# Patient Record
Sex: Male | Born: 1975 | Hispanic: No | Marital: Married | Smoking: Current every day smoker
Health system: Southern US, Community
[De-identification: ages and names within clinical notes are randomized; demographics above are authoritative.]

## PROBLEM LIST (undated history)

## (undated) DIAGNOSIS — J45909 Unspecified asthma, uncomplicated: Secondary | ICD-10-CM

## (undated) DIAGNOSIS — G43909 Migraine, unspecified, not intractable, without status migrainosus: Secondary | ICD-10-CM

---

## 2016-10-17 ENCOUNTER — Other Ambulatory Visit: Payer: Self-pay | Admitting: Nurse Practitioner

## 2016-10-17 DIAGNOSIS — G43019 Migraine without aura, intractable, without status migrainosus: Secondary | ICD-10-CM

## 2016-11-01 ENCOUNTER — Ambulatory Visit
Admission: RE | Admit: 2016-11-01 | Discharge: 2016-11-01 | Disposition: A | Discharge status: Home / Self Care | Payer: BC Managed Care – PPO | Source: Ambulatory Visit | Attending: Nurse Practitioner | Admitting: Nurse Practitioner

## 2016-11-01 DIAGNOSIS — G43109 Migraine with aura, not intractable, without status migrainosus: Secondary | ICD-10-CM | POA: Insufficient documentation

## 2016-11-01 DIAGNOSIS — J349 Unspecified disorder of nose and nasal sinuses: Secondary | ICD-10-CM | POA: Diagnosis not present

## 2016-11-01 DIAGNOSIS — G930 Cerebral cysts: Secondary | ICD-10-CM | POA: Insufficient documentation

## 2016-11-01 DIAGNOSIS — G43019 Migraine without aura, intractable, without status migrainosus: Secondary | ICD-10-CM | POA: Diagnosis present

## 2016-11-01 MED ORDER — GADOBENATE DIMEGLUMINE 529 MG/ML IV SOLN: 20.0000 mL | Freq: Once | INTRAVENOUS | Status: DC | PRN

## 2018-02-19 IMAGING — MR MR HEAD WO/W CM
10 series · 46 of 48 positions shown · IV contrast (multihance)
Comparison: None.

CLINICAL DATA: 41-year-old male with headaches, associated orbital
pressure. Some associated dizziness and visual changes.

EXAM:
MRI HEAD WITHOUT AND WITH CONTRAST
TECHNIQUE: Multiplanar, multiecho pulse sequences of the brain and surrounding
structures were obtained without and with intravenous contrast.
CONTRAST:  20 mL MultiHance

[Series 3: DWI · axial · 3.0mm · 1.20mm/px · z∈[-58,+104]mm · 4 of 55 slices shown (1 of 2)]
[im 1/55]
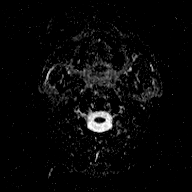
[im 19/55]
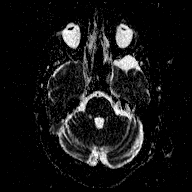
[im 37/55]
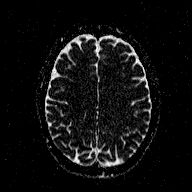
[im 55/55]
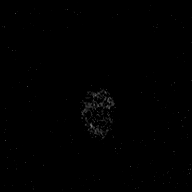

[Series 4: T1 · sagittal · 5.0mm · 0.45mm/px · 2 of 23 slices shown (1 of 2)]
[im 1/23]
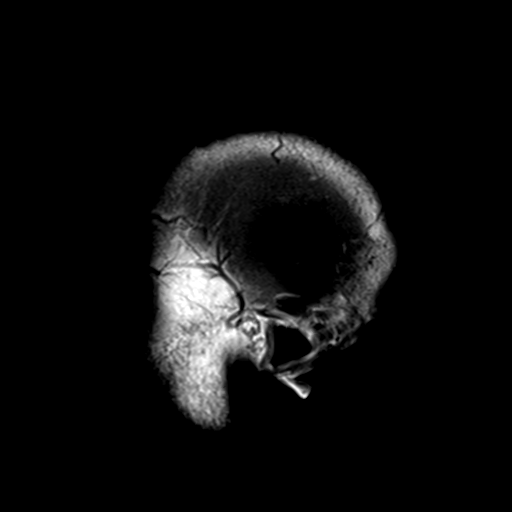
[im 23/23]
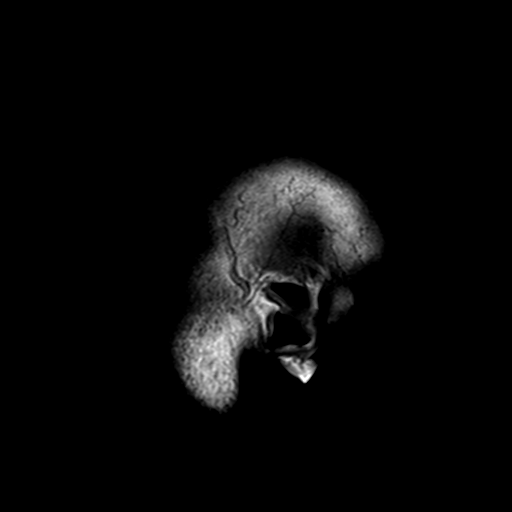

[Series 5: T2 · axial · 5.0mm · 0.72mm/px · z∈[-58,+103]mm · 2 of 24 slices shown (1 of 2)]
[im 1/24]
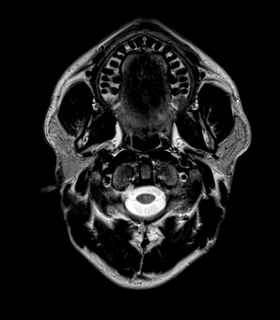
[im 24/24]
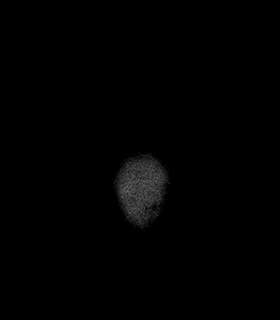

[Series 6: FLAIR · axial · 3.0mm · 0.45mm/px · z∈[-58,+104]mm · 4 of 55 slices shown]
[im 1/55]
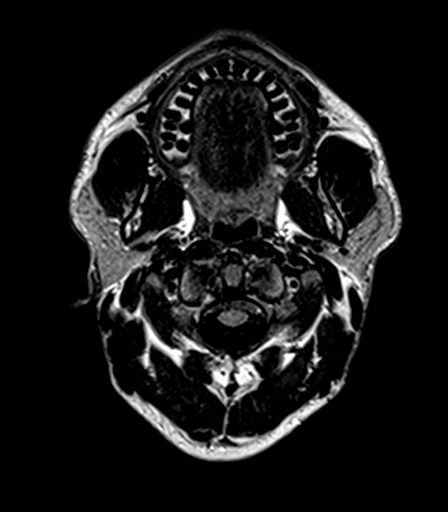
[im 19/55]
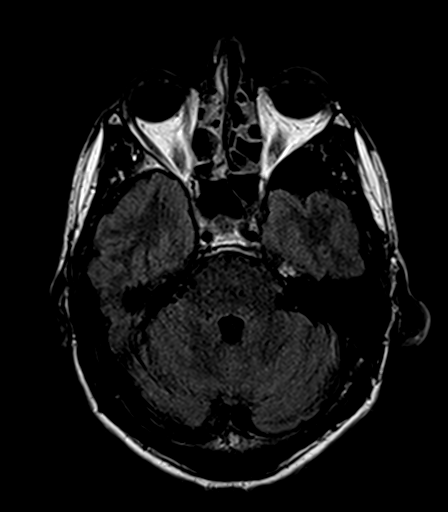
[im 37/55]
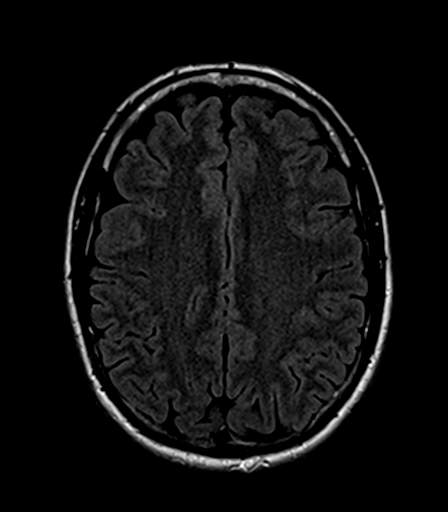
[im 55/55]
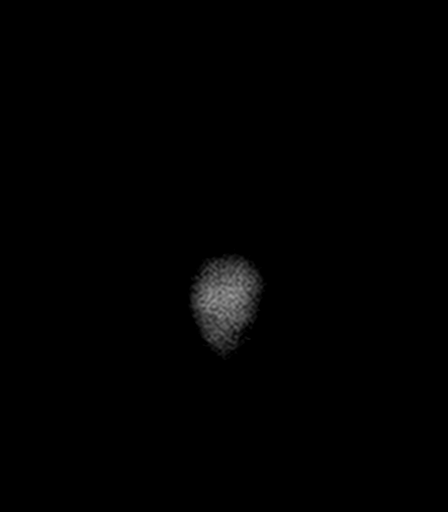

[Series 7: T2 · axial · 5.0mm · 0.72mm/px · z∈[-58,+103]mm · 2 of 24 slices shown (2 of 2)]
[im 1/24]
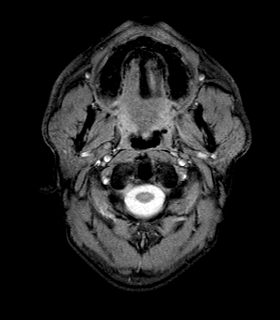
[im 24/24]
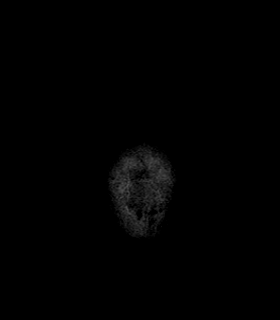

[Series 8: T1 · axial · 1.0mm · 1.00mm/px · z∈[-57,+102]mm · 10 of 160 slices shown (2 of 2)]
[im 1/160]
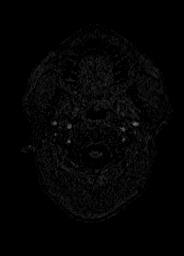
[im 15/160]
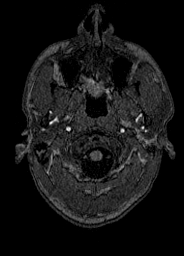
[im 29/160]
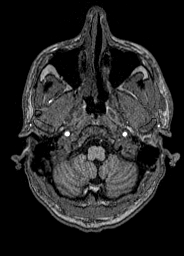
[im 44/160]
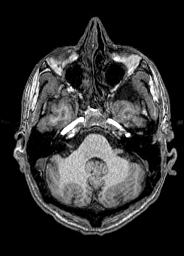
[im 58/160]
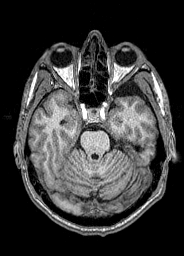
[im 73/160]
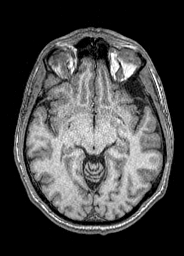
[im 87/160]
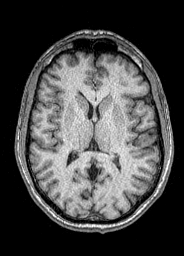
[im 116/160]
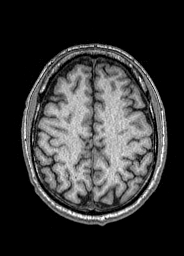
[im 131/160]
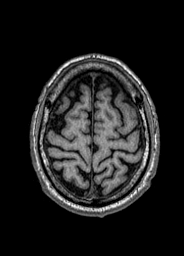
[im 160/160]
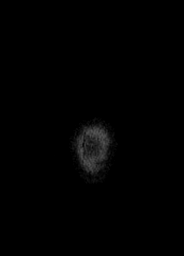

[Series 9: T2 post-contrast · coronal · 5.0mm · 0.45mm/px · 3 of 33 slices shown]
[im 1/33]
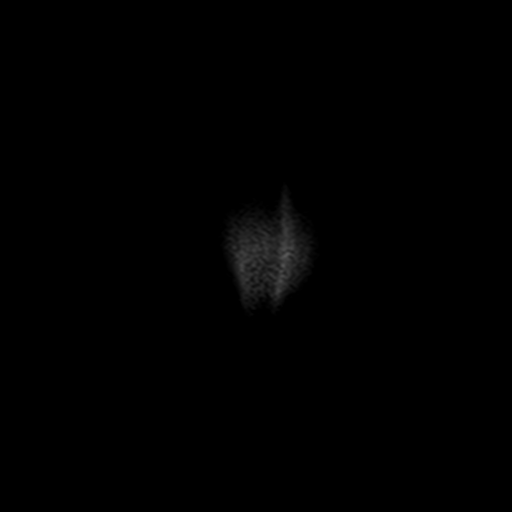
[im 17/33]
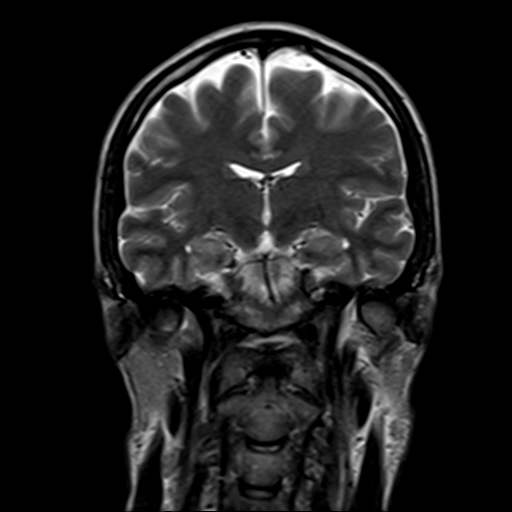
[im 33/33]
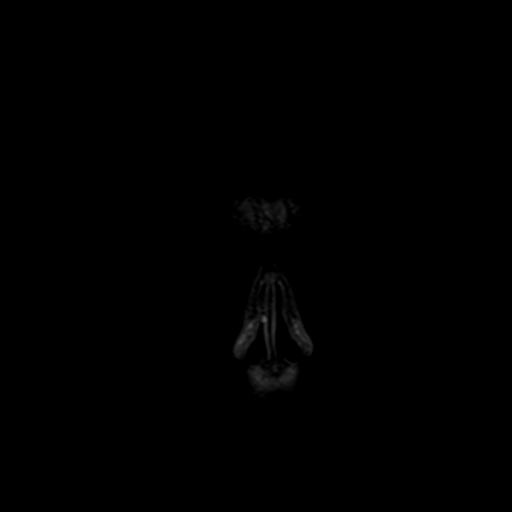

[Series 10: T1 post-contrast · axial · 1.0mm · 1.00mm/px · z∈[-57,+102]mm · 12 of 160 slices shown (1 of 2)]
[im 1/160]
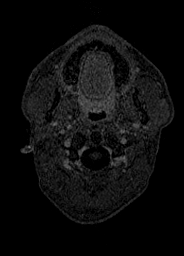
[im 15/160]
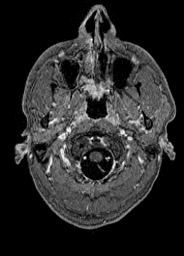
[im 29/160]
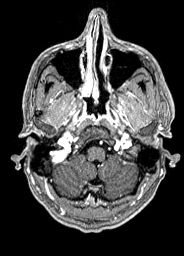
[im 44/160]
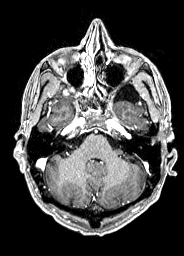
[im 58/160]
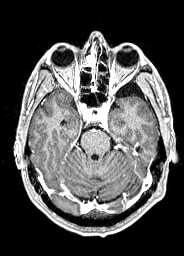
[im 73/160]
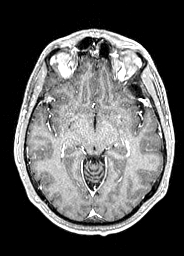
[im 87/160]
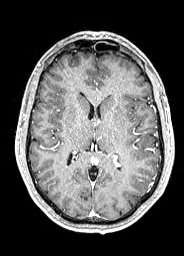
[im 102/160]
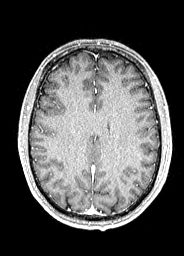
[im 116/160]
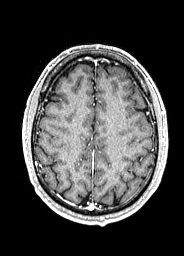
[im 131/160]
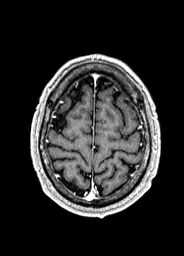
[im 145/160]
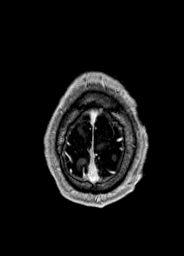
[im 160/160]
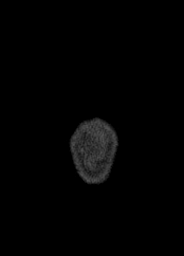

[Series 11: T1 post-contrast · coronal · 5.0mm · 0.45mm/px · 3 of 33 slices shown (2 of 2)]
[im 1/33]
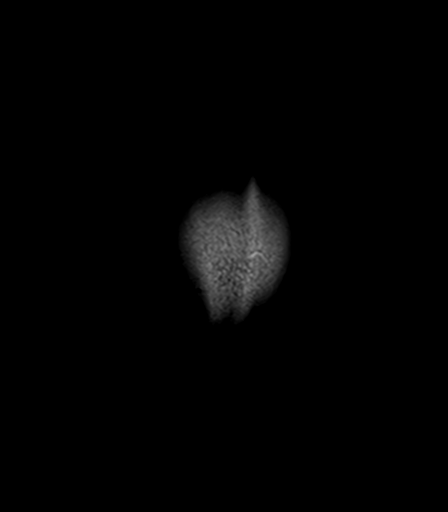
[im 17/33]
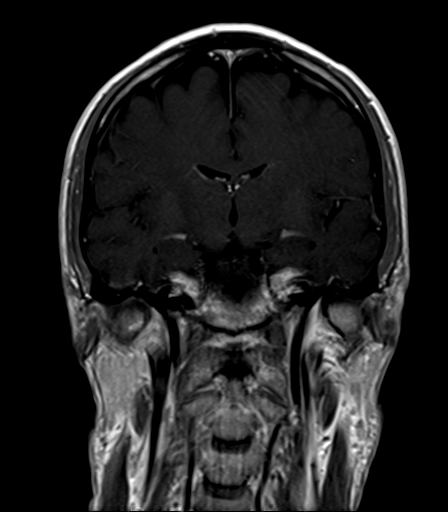
[im 33/33]
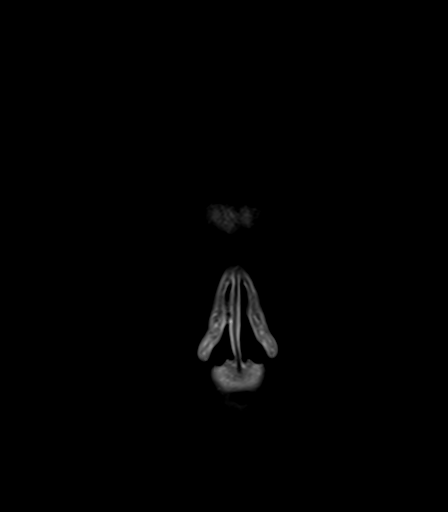

[Series 100: DWI · axial · 3.0mm · 1.20mm/px · z∈[-58,+104]mm · 4 of 55 slices shown (2 of 2)]
[im 1/55]
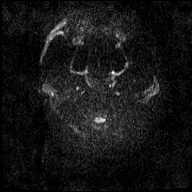
[im 19/55]
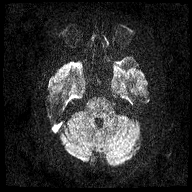
[im 37/55]
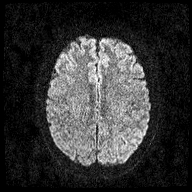
[im 55/55]
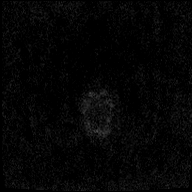

[46 of 48 positions shown; findings below may reference images not displayed]

FINDINGS: Brain: Nonenhancing CSF isointense 20 x 27 x 29 mm cyst in the left
middle cranial fossa (AP by transverse by CC). Mild mass effect on
the left anterior temporal tip with no associated edema or other
signal abnormality (series 6, image 19).

No other extra-axial collection. Cerebral volume is within normal
limits. No restricted diffusion to suggest acute infarction. No
midline shift, ventriculomegaly, or acute intracranial hemorrhage.
Cervicomedullary junction and pituitary are within normal limits.

Gray and white matter signal is within normal limits throughout the
brain. No encephalomalacia or chronic cerebral blood products. No
abnormal enhancement identified. No dural thickening.

Vascular: Major intracranial vascular flow voids are preserved and
appear normal. The major dural venous sinuses are enhancing and
appear patent.

Skull and upper cervical spine: Normal.  Normal bone marrow signal.

Sinuses/Orbits: Bilateral orbits soft tissues are normal.

Scattered mild to moderate paranasal sinus mucosal thickening, most
pronounced in the ethmoids. No sinus fluid level.

Other: Visible internal auditory structures appear normal. Trace
fluid in the inferior right mastoid air cells, likely
postinflammatory and significance doubtful. The left mastoids are
clear. Negative nasopharynx.

Scalp and face soft tissues appear negative.
IMPRESSION: 1. Small 2.9 cm arachnoid cyst in the left middle cranial fossa is
likely congenital and appears inconsequential.
2. Otherwise normal MRI appearance of the brain.
3. Mild to moderate scattered paranasal sinus mucosal thickening.
Likely postinflammatory trace right mastoid fluid.

## 2021-09-19 ENCOUNTER — Ambulatory Visit
Admission: EM | Admit: 2021-09-19 | Discharge: 2021-09-19 | Disposition: A | Discharge status: Home / Self Care | Payer: BC Managed Care – PPO | Attending: Family Medicine | Admitting: Family Medicine

## 2021-09-19 ENCOUNTER — Encounter: Payer: Self-pay | Admitting: Emergency Medicine

## 2021-09-19 DIAGNOSIS — S61412A Laceration without foreign body of left hand, initial encounter: Secondary | ICD-10-CM

## 2021-09-19 HISTORY — DX: Unspecified asthma, uncomplicated: J45.909

## 2021-09-19 HISTORY — DX: Migraine, unspecified, not intractable, without status migrainosus: G43.909

## 2021-09-19 MED ORDER — CEPHALEXIN 500 MG PO CAPS: 500.0000 mg | ORAL_CAPSULE | Freq: Three times a day (TID) | ORAL | 0 refills | Status: AC

## 2021-09-19 NOTE — Discharge Instructions (Signed)
Return to urgent care in 1 week for suture removal. Take Tylenol or Motrin as needed for pain.  If pain worsens or bleeding returns, go to the emergency department.

## 2021-09-19 NOTE — ED Provider Notes (Signed)
[facility]    CSN: [id number] Arrival date & time: 09/19/21  1140      History   Chief Complaint Chief Complaint  Patient presents with   Extremity Laceration    Left hand    HPI Spencer Chaney is a 46 y.o. male.   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  He states he cut his left hand and had a lot of bleeding.  He wrapped it up and came to the urgent care.  He has not had any weakness, nausea, vomiting or swelling.  He is right-handed.   He has no other concerns  Past Medical History:  Diagnosis Date   Asthma    Migraine     There are no problems to display for this patient.   History reviewed. No pertinent surgical history.     Home Medications    Prior to Admission medications   Medication Sig Start Date End Date Taking? Authorizing Provider  aspirin EC 81 MG tablet Take 81 mg by mouth daily. Swallow whole.   Yes [provider]  cephALEXin (KEFLEX) 500 MG capsule Take 1 capsule (500 mg total) by mouth 3 (three) times daily for 7 days. 09/19/21 09/26/21 Yes Zackary Mckeone, DO  albuterol (VENTOLIN HFA) 108 (90 Base) MCG/ACT inhaler Inhale into the lungs. 08/03/21   [provider]  BREO ELLIPTA 100-25 MCG/ACT AEPB Inhale 1 puff into the lungs daily. 08/28/21   [provider]  buPROPion (WELLBUTRIN XL) 150 MG 24 hr tablet Take 150 mg by mouth every morning. 08/22/21   [provider]  busPIRone (BUSPAR) 15 MG tablet Take 15 mg by mouth 2 (two) times daily. 08/22/21   [provider]  cetirizine (ZYRTEC) 10 MG tablet Take by mouth.    [provider]  ipratropium (ATROVENT) 0.06 % nasal spray Place into both nostrils. 09/07/21   [provider]  NURTEC 75 MG TBDP Take by mouth. 08/27/21   [provider]  sertraline (ZOLOFT) 100 MG tablet Take 100 mg by mouth daily. 08/22/21   [provider]   SUMAtriptan (IMITREX) 100 MG tablet Take by mouth. 08/03/21   [provider]  topiramate (TOPAMAX) 50 MG tablet Take 50 mg by mouth 3 (three) times daily. 08/22/21   [provider]    Family History History reviewed. No pertinent family history.  Social History Social History   Tobacco Use   Smoking status: Every Day    Types: Cigarettes   Smokeless tobacco: Never  Vaping Use   Vaping Use: Never used  Substance Use Topics   Alcohol use: Never   Drug use: Never     Allergies   Amphetamine-dextroamphetamine   Review of Systems Review of Systems : :negative unless otherwise stated in HPI.      Physical Exam Triage Vital Signs ED Triage Vitals  Enc Vitals Group     BP 09/19/21 1152 (!) 127/93     Pulse Rate 09/19/21 1152 68     Resp 09/19/21 1152 15     Temp 09/19/21 1152 98.1 F (36.7 C)     Temp Source 09/19/21 1152 Oral     SpO2 09/19/21 1152 96 %     Weight 09/19/21 1147 205 lb (93 kg)     Height 09/19/21 1147 6\' 4"  (1.93 m)     Head Circumference --      Peak  Flow --      Pain Score 09/19/21 1147 2     Pain Loc --      Pain Edu? --      Excl. in GC? --    No data found.  Updated Vital Signs BP (!) 127/93 (BP Location: Left Arm)   Pulse 68   Temp 98.1 F (36.7 C) (Oral)   Resp 15   Ht 6\' 4"  (1.93 m)   Wt 93 kg   SpO2 96%   BMI 24.95 kg/m   Visual Acuity Right Eye Distance:   Left Eye Distance:   Bilateral Distance:    Right Eye Near:   Left Eye Near:    Bilateral Near:     Physical Exam  GEN: alert, well appearing male, in no acute distress    EYES:   pupils equal and reactive, no scleral injection  NECK:  normal ROM RESP:   no increased work of breathing  CVS:   regular rate, distal pulses intact EXT:   normal ROM of thumb in all directions, strength 5/5 NEURO:  left hand sensation intact  SKIN: 2.5 cm x 1 cm L shaped thumb laceration, actively bleeding      UC Treatments / Results  Labs (all labs ordered  are listed, but only abnormal results are displayed) Labs Reviewed - No data to display  EKG   Radiology No results found.  Procedures Procedures (including critical care time)  Medications Ordered in UC Medications - No data to display  Initial Impression / Assessment and Plan / UC Course  I have reviewed the triage vital signs and the nursing notes.  Pertinent labs & imaging results that were available during my care of the patient were reviewed by me and considered in my medical decision making (see chart for details).     Pt is a 46 y.o. male who presents after injuring left thumb  at home just prior to arrival. L-shaped laceration was repaired patient tolerated procedure well.  Pt received 3 internal sutures and 6 external sutures.  Home care instructions provided. OTC analgesics as needed for pain.  Tetanus UTD; given 09/13/2018.   Final Clinical Impressions(s) / UC Diagnoses   Final diagnoses:  Laceration of left hand without foreign body, initial encounter     Discharge Instructions      Return to urgent care in 1 week for suture removal. Take Tylenol or Motrin as needed for pain.  If pain worsens or bleeding returns, go to the emergency department.     ED Prescriptions     Medication Sig Dispense Auth. Provider   cephALEXin (KEFLEX) 500 MG capsule Take 1 capsule (500 mg total) by mouth 3 (three) times daily for 7 days. 21 capsule 09/15/2018, DO      PDMP not reviewed this encounter.   Katha Cabal, DO 09/19/21 1330

## 2021-09-19 NOTE — ED Triage Notes (Signed)
Patient states that he cu t his left hand with a wood chisel about 30 min ago.  Patient has his left hand wrapped with coban at this time.

## 2021-09-21 DIAGNOSIS — S61412A Laceration without foreign body of left hand, initial encounter: Secondary | ICD-10-CM | POA: Diagnosis not present
